# Patient Record
Sex: Female | Born: 1990 | Hispanic: Yes | Marital: Single | Smoking: Never smoker
Health system: Southern US, Community
[De-identification: ages and names within clinical notes are randomized; demographics above are authoritative.]

## PROBLEM LIST (undated history)

## (undated) ENCOUNTER — Inpatient Hospital Stay (HOSPITAL_COMMUNITY): Payer: Self-pay

## (undated) DIAGNOSIS — Z789 Other specified health status: Secondary | ICD-10-CM

## (undated) HISTORY — PX: NO PAST SURGERIES: SHX2092

---
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
## 2012-06-09 ENCOUNTER — Ambulatory Visit: Payer: BC Managed Care – PPO | Admitting: Family Medicine

## 2012-06-09 VITALS — BP 110/60 | HR 77 | Temp 98.5°F | Resp 16 | Ht 61.0 in | Wt 141.0 lb

## 2012-06-09 DIAGNOSIS — K5900 Constipation, unspecified: Secondary | ICD-10-CM

## 2012-06-09 DIAGNOSIS — K625 Hemorrhage of anus and rectum: Secondary | ICD-10-CM

## 2012-06-09 MED ORDER — HYDROCORTISONE ACETATE 25 MG RE SUPP: 25.0000 mg | Freq: Two times a day (BID) | RECTAL | Status: DC

## 2012-06-09 NOTE — Progress Notes (Signed)
[facility] [address], [place] [address] [phone]  Date:  06/09/2012   Name:  Kristine Herman   DOB:  04-02-1990   MRN:  [id number]  PCP:  No primary provider on file.    Chief Complaint: Constipation and Rectal Bleeding   History of Present Illness:  Kristine Herman is a 22 y.o. very pleasant female patient who presents with the following:  She delivered a baby 6 months ago- a baby girl who is doing well   Since delivery she had noted problems with constipation- she has to strain to have a BM, and only has a BM about twice a week.  She is no longer nursing the baby.   She is no longer on pre-natal vitamins, not taking iron- unsure of anything that could have caused her constipation.   She has tried OTC fiber but has not had success  She has seen blood in the toilet bowl- she has noted this for about 2 months. It occurs with each BM, but these are infrequent as above.  She has pain when she has a BM.   She is otherwise healthy.  She has her menses right now.  Her last BM was 2 days ago.   She had a vaginal delivery  She has never had this problem in the past.    Patient Active Problem List  Diagnosis  . NSVD (normal spontaneous vaginal delivery)    Past Medical History  Diagnosis Date  . No pertinent past medical history     Past Surgical History  Procedure Laterality Date  . No past surgeries      History  Substance Use Topics  . Smoking status: Never Smoker   . Smokeless tobacco: Not on file  . Alcohol Use: No    History reviewed. No pertinent family history.  No Known Allergies  Medication list has been reviewed and updated.  Current Outpatient Prescriptions on File Prior to Visit  Medication Sig Dispense Refill  . ibuprofen (ADVIL,MOTRIN) 600 MG tablet Take 1 tablet (600 mg total) by mouth every 6 (six) hours.  30 tablet  1  . Prenatal Vit-Fe Fumarate-FA (PRENATAL MULTIVITAMIN) TABS Take 1 tablet by mouth  every morning.      . senna-docusate (SENOKOT-S) 8.6-50 MG per tablet Take 2 tablets by mouth at bedtime.  60 tablet  1   No current facility-administered medications on file prior to visit.    Review of Systems: As per HPI- otherwise negative.  Physical Examination: Filed Vitals:   06/09/12 1441  BP: 110/60  Pulse: 77  Temp: 98.5 F (36.9 C)  Resp: 16   Filed Vitals:   06/09/12 1441  Height: 5\' 1"  (1.549 m)  Weight: 141 lb (63.957 kg)   Body mass index is 26.66 kg/(m^2). Ideal Body Weight: Weight in (lb) to have BMI = 25: 132  GEN: WDWN, NAD, Non-toxic, A & O x 3 HEENT: Atraumatic, Normocephalic. Neck supple. No masses, No LAD. Ears and Nose: No external deformity. CV: RRR, No M/G/R. No JVD. No thrill. No extra heart sounds. PULM: CTA B, no wheezes, crackles, rhonchi. No retractions. No resp. distress. No accessory muscle use. ABD: S, NT, ND, +BS. No rebound. No HSM. EXTR: No c/c/e NEURO Normal gait.  PSYCH: Normally interactive. Conversant. Not depressed or anxious appearing.  Calm demeanor.  Rectal exam: no visible external fissures or hemorrhoids.    Performed anoscopy- visible fissure in the anal canal, no hemorrhoids noted.  Assessment and Plan: Rectal bleeding - Plan: hydrocortisone (ANUSOL-HC) 25 MG suppository  Unspecified constipation  Constipation causing rectal bleeding- will treat as per pt instructions.  Advised to recheck in one month with [place] or her PCP to ensure her stool is free of blood.    Abbe Amsterdam, MD

## 2012-06-09 NOTE — Patient Instructions (Addendum)
Your constipation is causing your pain and bleeding- we need to get your stool softened to allow your bottom to heal.    Please purchase some miralax over the counter and use according to the directions- one dose a day.  Also start eating a serving of high fiber cereal such as all bran or fiber one each day.  Drink lots of water, and you may try some fruit juice as well.   Use the suppositories as needed to help your anal canal to heal.

## 2013-02-24 ENCOUNTER — Encounter (HOSPITAL_COMMUNITY): Payer: Self-pay | Admitting: Emergency Medicine

## 2013-02-24 ENCOUNTER — Emergency Department (INDEPENDENT_AMBULATORY_CARE_PROVIDER_SITE_OTHER)
Admission: EM | Admit: 2013-02-24 | Discharge: 2013-02-24 | Disposition: A | Discharge status: Home / Self Care | Payer: BC Managed Care – PPO | Source: Home / Self Care

## 2013-02-24 DIAGNOSIS — Y9389 Activity, other specified: Secondary | ICD-10-CM

## 2013-02-24 DIAGNOSIS — R0789 Other chest pain: Secondary | ICD-10-CM

## 2013-02-24 DIAGNOSIS — T148XXA Other injury of unspecified body region, initial encounter: Secondary | ICD-10-CM

## 2013-02-24 DIAGNOSIS — R071 Chest pain on breathing: Secondary | ICD-10-CM

## 2013-02-24 DIAGNOSIS — Y9229 Other specified public building as the place of occurrence of the external cause: Secondary | ICD-10-CM

## 2013-02-24 NOTE — ED Provider Notes (Signed)
Medical screening examination/treatment/procedure(s) were performed by a resident physician or non-physician practitioner and as the supervising physician I was immediately available for consultation/collaboration.  Aviannah Castoro, MD    Neyah Ellerman S Halyn Flaugher, MD 02/24/13 1911 

## 2013-02-24 NOTE — ED Notes (Signed)
Patient states that she has had pain above the left breast for approx 4 weeks, first experienced pain after leaving the gym

## 2013-02-24 NOTE — ED Provider Notes (Signed)
CSN: [id number]     Arrival date & time 02/24/13  1044 History   First MD Initiated Contact with Patient 02/24/13 1126     Chief Complaint  Patient presents with  . Breast Pain   (Consider location/radiation/quality/duration/timing/severity/associated sxs/prior Treatment) HPI Comments: 22 year old female presents with left anterior chest wall pain. She states that approximately one month ago, 3 hours after working out in the gym she developed pain in the left anterior chest. It is worse with taking a deep breath and certain movements. She notices it when she presses on her chest is often tender. Is also worse when lying down and placed him in various positions. She has no shortness of breath, cough, exertional symptoms, heaviness, tightness, fullness or pressure. She considers herself to be in good health and  her past medical history is noncontributory.    Past Medical History  Diagnosis Date  . No pertinent past medical history    Past Surgical History  Procedure Laterality Date  . No past surgeries     No family history on file. History  Substance Use Topics  . Smoking status: Never Smoker   . Smokeless tobacco: Not on file  . Alcohol Use: No   OB History   Grav Para Term Preterm Abortions TAB SAB Ect Mult Living   1 1 1  0 0 0 0 0 0 1     Review of Systems  Constitutional: Negative for fever, chills and activity change.  HENT: Negative.   Respiratory: Negative.   Cardiovascular: Positive for chest pain. Negative for palpitations and leg swelling.  Gastrointestinal: Negative.   Musculoskeletal: Negative for back pain, gait problem, joint swelling, myalgias and neck pain.       As per HPI  Skin: Negative for color change, pallor and rash.  Neurological: Negative.     Allergies  Review of patient's allergies indicates no known allergies.  Home Medications   Current Outpatient Rx  Name  Route  Sig  Dispense  Refill  . ibuprofen (ADVIL,MOTRIN) 600 MG tablet   Oral  Take 1 tablet (600 mg total) by mouth every 6 (six) hours.   30 tablet   1    BP 110/69  Pulse 87  Temp(Src) 97.9 F (36.6 C) (Oral)  Resp 17  SpO2 96%  LMP 02/10/2013  Breastfeeding? No Physical Exam  Nursing note and vitals reviewed. Constitutional: She is oriented to person, place, and time. She appears well-developed and well-nourished. No distress.  HENT:  Head: Normocephalic and atraumatic.  Eyes: EOM are normal. Pupils are equal, round, and reactive to light.  Neck: Normal range of motion. Neck supple.  Cardiovascular: Normal rate, regular rhythm, normal heart sounds and intact distal pulses.   Pulmonary/Chest: Effort normal and breath sounds normal. No respiratory distress. She has no wheezes. She has no rales. She exhibits tenderness.  Tenderness elicited with deep palpation to the left upper anterior chest wall. No tenderness to the right anterior chest wall. Unable to reproduce the pain with arm movements such as abduction, butterfly movements.  Musculoskeletal: Normal range of motion. She exhibits no edema.  Neurological: She is alert and oriented to person, place, and time. No cranial nerve deficit.  Skin: Skin is warm and dry.  Psychiatric: She has a normal mood and affect.    ED Course  Procedures (including critical care time) Labs Review Labs Reviewed - No data to display Imaging Review No results found.     MDM   1. Right-sided chest wall pain  2. Muscle strain      Ice prn Ibuprofen prn No resistance training with L arm for at least a couple of more weeks, then start gradually Reassurance.  Hayden Rasmussen, NP 02/24/13 1227

## 2013-10-04 ENCOUNTER — Emergency Department (INDEPENDENT_AMBULATORY_CARE_PROVIDER_SITE_OTHER)
Admission: EM | Admit: 2013-10-04 | Discharge: 2013-10-04 | Disposition: A | Discharge status: Home / Self Care | Payer: BC Managed Care – PPO | Source: Home / Self Care | Attending: Emergency Medicine | Admitting: Emergency Medicine

## 2013-10-04 ENCOUNTER — Encounter (HOSPITAL_COMMUNITY): Payer: Self-pay | Admitting: Emergency Medicine

## 2013-10-04 ENCOUNTER — Emergency Department (INDEPENDENT_AMBULATORY_CARE_PROVIDER_SITE_OTHER): Payer: BC Managed Care – PPO

## 2013-10-04 DIAGNOSIS — J209 Acute bronchitis, unspecified: Secondary | ICD-10-CM

## 2013-10-04 MED ORDER — IPRATROPIUM BROMIDE 0.06 % NA SOLN: 2.0000 | Freq: Four times a day (QID) | NASAL | Status: DC

## 2013-10-04 MED ORDER — HYDROCOD POLST-CHLORPHEN POLST 10-8 MG/5ML PO LQCR: 5.0000 mL | Freq: Two times a day (BID) | ORAL | Status: DC | PRN

## 2013-10-04 MED ORDER — AZITHROMYCIN 250 MG PO TABS: ORAL_TABLET | ORAL | Status: DC

## 2013-10-04 NOTE — ED Provider Notes (Signed)
Chief Complaint   Chief Complaint  Patient presents with  . URI    History of Present Illness   Kristine Herman is a 23 year old female who made a trip to [place] a couple weeks ago. Upon her return back here, 12 days ago, she developed a dry cough, wheezing, chest tightness, chest pain, fever up to 100.2, chills, headache, aching in her back. She denies any sore throat, nasal congestion, rhinorrhea, or GI symptoms.  Review of Systems   Other than as noted above, the patient denies any of the following symptoms: Systemic:  No fevers, chills, sweats, or myalgias. Eye:  No redness or discharge. ENT:  No ear pain, headache, nasal congestion, drainage, sinus pressure, or sore throat. Neck:  No neck pain, stiffness, or swollen glands. Lungs:  No cough, sputum production, hemoptysis, wheezing, chest tightness, shortness of breath or chest pain. GI:  No abdominal pain, nausea, vomiting or diarrhea.  [place]   Past medical history, family history, social history, meds, and allergies were reviewed.   Physical exam   Vital signs:  BP 113/78  Pulse 111  Temp(Src) 100.2 F (37.9 C) (Oral)  Resp 14  SpO2 98%  LMP 09/15/2013 General:  Alert and oriented.  In no distress.  Skin warm and dry. Eye:  No conjunctival injection or drainage. Lids were normal. ENT:  TMs and canals were normal, without erythema or inflammation.  Nasal mucosa was clear and uncongested, without drainage.  Mucous membranes were moist.  Pharynx was clear with no exudate or drainage.  There were no oral ulcerations or lesions. Neck:  Supple, no adenopathy, tenderness or mass. Lungs:  No respiratory distress.  Lungs were clear to auscultation, without wheezes, rales or rhonchi.  Breath sounds were clear and equal bilaterally.  Heart:  Regular rhythm, without gallops, murmers or rubs. Skin:  Clear, warm, and dry, without rash or lesions.   Radiology   Dg Chest 2 View  10/04/2013   CLINICAL DATA:  Back pain, fever   EXAM: CHEST  2 VIEW  COMPARISON:  None.  FINDINGS: Normal mediastinum and cardiac silhouette. Normal pulmonary vasculature. No evidence of effusion, infiltrate, or pneumothorax. No acute bony abnormality.  IMPRESSION: No acute cardiopulmonary process.   Electronically Signed   By: Suzy Bouchard M.D.   On: 10/04/2013 10:13   Assessment     The encounter diagnosis was Acute bronchitis, unspecified organism.  Plan    1.  Meds:  The following meds were prescribed:   Discharge Medication List as of 10/04/2013 10:23 AM    START taking these medications   Details  azithromycin (ZITHROMAX Z-PAK) 250 MG tablet Take as directed., Normal    chlorpheniramine-HYDROcodone (TUSSIONEX) 10-8 MG/5ML LQCR Take 5 mLs by mouth every 12 (twelve) hours as needed for cough., Starting 10/04/2013, Until Discontinued, Normal    ipratropium (ATROVENT) 0.06 % nasal spray Place 2 sprays into both nostrils 4 (four) times daily., Starting 10/04/2013, Until Discontinued, Normal        2.  Patient Education/Counseling:  The patient was given appropriate handouts, self care instructions, and instructed in symptomatic relief.  Instructed to get extra fluids, rest, and use a cool mist vaporizer.    3.  Follow up:  The patient was told to follow up here if no better in 3 to 4 days, or sooner if becoming worse in any way, and given some red flag symptoms such as increasing fever, difficulty breathing, chest pain, or persistent vomiting which would prompt immediate return.  Follow  up here as needed.      Harden Mo, MD 10/04/13 [phone]

## 2013-10-04 NOTE — ED Notes (Signed)
URI syx x 10 days; recent return from [place] (flew)

## 2013-10-04 NOTE — Discharge Instructions (Signed)
Bronquitis aguda ( Acute Bronchitis) La bronquitis es una inflamacin de las vas respiratorias que se extienden desde la trquea [facility] pulmones (bronquios). La inflamacin produce la formacin de mucosidad. Esto produce tos, que es el sntoma ms frecuente de la bronquitis.  Cuando la bronquitis es [place], generalmente comienza de [place] sbita y desaparece luego de un par de semanas. El hbito de fumar, las alergias y el asma pueden empeorar la bronquitis. Los episodios repetidos de bronquitis pueden causar ms problemas pulmonares.  CAUSAS La causa ms frecuente de bronquitis aguda es el mismo virus que produce el resfro. El virus se propaga de persona a persona (contagioso).  Joice.   Cristy Hilts.   Tos con mucosidad.   Dolores [employer] cuerpo.   Congestin en el pecho.   Escalofros.   Falta de aire.   Dolor de Investment banker, operational.  DIAGNSTICO  La bronquitis aguda en general se diagnostica con un examen fsico. En algunos casos se indican otros estudios, como radiografas, para Clinical research associate.  TRATAMIENTO  La bronquitis aguda generalmente desaparece en un par de semanas. Con frecuencia no es Systems analyst. Los medicamentos se indican para aliviar la fiebre o la tos. Generalmente no es necesario el uso de antibiticos, Armed forces training and education officer pueden indicarse en ciertas ocasiones. En algunos casos, se recomienda el uso de un inhalador para mejorar la falta de aire y Aeronautical engineer tos. Un vaporizador de aire fro podr ayudarlo a [employer] bronquiales y Armed forces technical officer su eliminacin.  INSTRUCCIONES PARA EL CUIDADO EN EL HOGAR  Descanse lo suficiente.   Beba lquidos en abundancia para mantener la orina de color claro o amarillo plido (excepto que padezca una enfermedad que requiera la restriccin de lquidos). Tome mucho lquido para Radiation protection practitioner las secreciones y Tree surgeon.   Tome slo medicamentos de venta libre o recetados,  segn las indicaciones del mdico.   Evite fumar o aspirar el humo de otros fumadores. La exposicin al humo del cigarrillo o a irritantes qumicos har que la bronquitis empeore. Si fuma, considere el uso de goma de Higher education careers adviser o la aplicacin de parches en la piel que contengan nicotina para Public house manager los sntomas de abstinencia. Si deja de fumar, sus pulmones se curarn ms rpido.   Reduzca la probabilidad de otro brote de bronquitis aguda lavando sus manos con frecuencia, evitando a las personas que tengan sntomas y tratando de no tocarse las manos con la boca, la nariz o los ojos.   Concurra a las consultas de control con su mdico segn las indicaciones.  SOLICITE ATENCIN MDICA SI: Los sntomas no mejoran despus de 1 semana de tratamiento.  SOLICITE ATENCIN MDICA DE INMEDIATO SI:  Comienza a tener fiebre o escalofros cada vez ms intensos.   Siente dolor en el pecho.   Le falta el aire de manera preocupante.  La flema tiene [place].   Se deshidrata.  Se desmaya.  Tiene vmitos que se repiten.  Tiene un dolor de cabeza intenso. ASEGRESE DE QUE:   Comprende estas instrucciones.  Controlar su afeccin.  Recibir ayuda de inmediato si no mejora o si empeora. Document Released: 03/15/2005 Document Revised: 11/15/2012 [facility] Patient Information 2015 [place]. This information is not intended to replace advice given to you by your health care provider. Make sure you discuss any questions you have with your health care provider.  Most upper respiratory infections are caused by viruses and do not require antibiotics.  We try to save the antibiotics for  when we really need them to prevent bacteria from developing resistance to them.  Here are a few hints about things that can be done at home to help get over an upper respiratory infection quicker:  Get extra sleep and extra fluids.  Get 7 to 9 hours of sleep per night and 6 to 8 glasses of water a day.  Getting  extra sleep keeps the immune system from getting run down.  Most people with an upper respiratory infection are a little dehydrated.  The extra fluids also keep the secretions liquified and easier to deal with.  Also, get extra vitamin C.  4000 mg per day is the recommended dose. For the aches, headache, and fever, acetaminophen or ibuprofen are helpful.  These can be alternated every 4 hours.  People with liver disease should avoid large amounts of acetaminophen, and people with ulcer disease, gastroesophageal reflux, gastritis, congestive heart failure, chronic kidney disease, coronary artery disease and the elderly should avoid ibuprofen. For nasal congestion try Mucinex-D, or if you're having lots of sneezing or clear nasal drainage use Zyrtec-D. People with high blood pressure can take these if their blood pressure is controlled, if not, it's best to avoid the forms with a "D" (decongestants).  You can use the plain Mucinex, Allegra, Claritin, or Zyrtec even if your blood pressure is not controlled.   A Saline nasal spray such as Ocean Spray can also help.  You can add a decongestant sprays such as Afrin, but you should not use the decongestant sprays for more than 3 or 4 days since they can be habituating.  Breathe Rite nasal strips can also offer a non-drug alternative treatment to nasal congestion, especially at night. For people with symptoms of sinusitis, sleeping with your head elevated can be helpful.  For sinus pain, moist, hot compresses to the face may provide some relief.  Many people find that inhaling steam as in a shower or from a pot of steaming water can help. For any viral infection, zinc containing lozenges such as Cold-Eze or Zicam are helpful.  Zinc helps to fight viral infection.  Hot salt water gargles (8 oz of hot water, 1/2 tsp of table salt, and a pinch of baking soda) can give relief as well as hot beverages such as hot tea.  Sucrets extra strength lozenges will help the sore  throat.  For the cough, take Delsym 2 tsp every 12 hours.  It has also been found recently that Aleve can help control a cough.  The dose is 1 to 2 tablets twice daily with food.  This can be combined with Delsym. (Note, if you are taking ibuprofen, you should not take Aleve as well--take one or the other.) A cool mist vaporizer will help keep your mucous membranes from drying out.   It's important when you have an upper respiratory infection not to pass the infection to others.  This involves being very careful about the following:  Frequent hand washing or use of hand sanitizer, especially after coughing, sneezing, blowing your nose or touching your face, nose or eyes. Do not shake hands or touch anyone and try to avoid touching surfaces that other people use such as doorknobs, shopping carts, telephones and computer keyboards. Use tissues and dispose of them properly in a garbage can or ziplock bag. Cough into your sleeve. Do not let others eat or drink after you.  It's also important to recognize the signs of serious illness and get evaluated if they occur:  Any respiratory infection that lasts more than 7 to 10 days.  Yellow nasal drainage and sputum are not reliable indicators of a bacterial infection, but if they last for more than 1 week, see your doctor. Fever and sore throat can indicate strep. Fever and cough can indicate influenza or pneumonia. Any kind of severe symptom such as difficulty breathing, intractable vomiting, or severe pain should prompt you to see a doctor as soon as possible.   Your body's immune system is really the thing that will get rid of this infection.  Your immune system is comprised of 2 types of specialized cells called T cells and B cells.  T cells coordinate the array of cells in your body that engulf invading bacteria or viruses while B cells orchestrate the production of antibodies that neutralize infection.  Anything we do or any medications we give you,  will just strengthen your immune system or help it clear up the infection quicker.  Here are a few helpful hints to improve your immune system to help overcome this illness or to prevent future infections:  A few vitamins can improve the health of your immune system.  That's why your diet should include plenty of fruits, vegetables, fish, nuts, and whole grains.  Vitamin A and bet-carotene can increase the cells that fight infections (T cells and B cells).  Vitamin A is abundant in dark greens and orange vegetables such as spinach, greens, sweet potatoes, and carrots.  Vitamin B6 contributes to the maturation of white blood cells, the cells that fight disease.  Foods with vitamin B6 include cold cereal and bananas.  Vitamin C is credited with preventing colds because it increases white blood cells and also prevents cellular damage.  Citrus fruits, peaches and green and red bell peppers are all hight in vitamin C.  Vitamin E is an anti-oxidant that encourages the production of natural killer cells which reject foreign invaders and B cells that produce antibodies.  Foods high in vitamin E include wheat germ, nuts and seeds.  Foods high in omega-3 fatty acids found in foods like salmon, tuna and mackerel boost your immune system and help cells to engulf and absorb germs.  Probiotics are good bacteria that increase your T cells.  These can be found in yogurt and are available in supplements such as Culturelle or Align.  Moderate exercise increases the strength of your immune system and your ability to recover from illness.  I suggest 3 to 5 moderate intensity 30 minute workouts per week.    Sleep is another component of maintaining a strong immune system.  It enables your body to recuperate from the day's activities, stress and work.  My recommendation is to get between 7 and 9 hours of sleep per night.  If you smoke, try to quit completely or at least cut down.  Drink alcohol only in moderation if  at all.  No more than 2 drinks daily for men or 1 for women.  Get a flu vaccine early in the fall or if you have not gotten one yet, once this illness has run its course.  If you are over 65, a smoker, or an asthmatic, get a pneumococcal vaccine.  My final recommendation is to maintain a healthy weight.  Excess weight can impair the immune system by interfering with the way the immune system deals with invading viruses or bacteria.

## 2014-01-28 ENCOUNTER — Encounter (HOSPITAL_COMMUNITY): Payer: Self-pay | Admitting: Emergency Medicine

## 2014-04-02 ENCOUNTER — Ambulatory Visit: Payer: BLUE CROSS/BLUE SHIELD

## 2014-12-18 ENCOUNTER — Ambulatory Visit (INDEPENDENT_AMBULATORY_CARE_PROVIDER_SITE_OTHER): Payer: BLUE CROSS/BLUE SHIELD | Admitting: Emergency Medicine

## 2014-12-18 VITALS — BP 110/84 | HR 72 | Temp 98.4°F | Resp 16 | Ht 61.0 in | Wt 159.0 lb

## 2014-12-18 DIAGNOSIS — Z23 Encounter for immunization: Secondary | ICD-10-CM

## 2014-12-18 DIAGNOSIS — S61209A Unspecified open wound of unspecified finger without damage to nail, initial encounter: Secondary | ICD-10-CM | POA: Diagnosis not present

## 2014-12-18 MED ORDER — CEPHALEXIN 500 MG PO CAPS: 500.0000 mg | ORAL_CAPSULE | Freq: Three times a day (TID) | ORAL | Status: DC

## 2014-12-18 NOTE — Progress Notes (Signed)
Procedure Consent obtained. 1 cc 1% lido local anesthesia. Cleaned with soap and water. Wound explored. #2 simple interrupted sutures placed. Rest of wound left open to drain. Clean dressing placed. Care instructions discussed.

## 2014-12-18 NOTE — Patient Instructions (Signed)
Td Vaccine (Tetanus and Diphtheria): What You Need to Know 1. Why get vaccinated? Tetanus  and diphtheria are very serious diseases. They are rare in the [place] today, but people who do become infected often have severe complications. Td vaccine is used to protect adolescents and adults from both of these diseases. Both tetanus and diphtheria are infections caused by bacteria. Diphtheria spreads from person to person through coughing or sneezing. Tetanus-causing bacteria enter the body through cuts, scratches, or wounds. TETANUS (Lockjaw) causes painful muscle tightening and stiffness, usually all over the body.  It can lead to tightening of muscles in the head and neck so you can't open your mouth, swallow, or sometimes even breathe. Tetanus kills about 1 out of every 5 people who are infected. DIPHTHERIA can cause a thick coating to form in the back of the throat.  It can lead to breathing problems, paralysis, heart failure, and death. Before vaccines, the [place] saw as many as 200,000 cases a year of diphtheria and hundreds of cases of tetanus. Since vaccination began, cases of both diseases have dropped by about 99%. 2. Td vaccine Td vaccine can protect adolescents and adults from tetanus and diphtheria. Td is usually given as a booster dose every 10 years but it can also be given earlier after a severe and dirty wound or burn. Your doctor can give you more information. Td may safely be given at the same time as other vaccines. 3. Some people should not get this vaccine  If you ever had a life-threatening allergic reaction after a dose of any tetanus or diphtheria containing vaccine, OR if you have a severe allergy to any part of this vaccine, you should not get Td. Tell your doctor if you have any severe allergies.  Talk to your doctor if you:  have epilepsy or another nervous system problem,  had severe pain or swelling after any vaccine containing diphtheria or  tetanus,  ever had Guillain Barr Syndrome (GBS),  aren't feeling well on the day the shot is scheduled. 4. Risks of a vaccine reaction With a vaccine, like any medicine, there is a chance of side effects. These are usually mild and go away on their own. Serious side effects are also possible, but are very rare. Most people who get Td vaccine do not have any problems with it. Mild Problems  following Td (Did not interfere with activities)  Pain where the shot was given (about 8 people in 10)  Redness or swelling where the shot was given (about 1 person in 3)  Mild fever (about 1 person in 15)  Headache or Tiredness (uncommon) Moderate Problems following Td (Interfered with activities, but did not require medical attention)  Fever over 102F (rare) Severe Problems  following Td (Unable to perform usual activities; required medical attention)  Swelling, severe pain, bleeding and/or redness in the arm where the shot was given (rare). Problems that could happen after any vaccine:  Brief fainting spells can happen after any medical procedure, including vaccination. Sitting or lying down for about 15 minutes can help prevent fainting, and injuries caused by a fall. Tell your doctor if you feel dizzy, or have vision changes or ringing in the ears.  Severe shoulder pain and reduced range of motion in the arm where a shot was given can happen, very rarely, after a vaccination.  Severe allergic reactions from a vaccine are very rare, estimated at less than 1 in a million doses. If one were to occur,   it would usually be within a few minutes to a few hours after the vaccination. 5. What if there is a serious reaction? What should I look for?  Look for anything that concerns you, such as signs of a severe allergic reaction, very high fever, or behavior changes. Signs of a severe allergic reaction can include hives, swelling of the face and throat, difficulty breathing, a fast heartbeat,  dizziness, and weakness. These would usually start a few minutes to a few hours after the vaccination. What should I do?  If you think it is a severe allergic reaction or other emergency that can't wait, call 9-1-1 or get the person to the nearest hospital. Otherwise, call your doctor.  Afterward, the reaction should be reported to the Vaccine Adverse Event Reporting System (VAERS). Your doctor might file this report, or you can do it yourself through the VAERS web site at www.vaers.hhs.gov, or by calling 1-800-822-7967. VAERS is only for reporting reactions. They do not give medical advice. 6. The National Vaccine Injury Compensation Program The National Vaccine Injury Compensation Program (VICP) is a federal program that was created to compensate people who may have been injured by certain vaccines. Persons who believe they may have been injured by a vaccine can learn about the program and about filing a claim by calling 1-800-338-2382 or visiting the VICP website at www.hrsa.gov/vaccinecompensation. 7. How can I learn more?  Ask your doctor.  Contact your local or state health department.  Contact the Centers for Disease Control and Prevention (CDC):  Call 1-800-232-4636 (1-800-CDC-INFO)  Visit CDC's website at www.cdc.gov/vaccines CDC Td Vaccine Interim VIS (05/02/12) Document Released: 01/10/2006 Document Revised: 07/30/2013 Document Reviewed: 06/27/2013 [employer] Patient Information 2015 [employer]. This information is not intended to replace advice given to you by your health care provider. Make sure you discuss any questions you have with your health care provider.  

## 2014-12-18 NOTE — Progress Notes (Signed)
Subjective:  This chart was scribed for Arlyss Queen MD, by Tamsen Roers, at [facility].  This patient was seen in room 7 and the patient's care was started at 1:15 PM.    Patient ID: Kristine Herman, female    DOB: 07-29-1990, 24 y.o.   MRN: [id number]   HPI  HPI Comments: Kristine Herman is a 24 y.o. female who presents to the [facility] complaining of a laceration on her left thumb onset last night (10 pm) when she was trying to cut a plastic tag with a knife but cut her finger instead. She states that she is not able to feel a part of her thumb and states that it hurts when she pushes on a part of her arm.  She does not remember when her last tetanus was.  She has no other complaints or concerns today.    Patient Active Problem List   Diagnosis Date Noted  . NSVD (normal spontaneous vaginal delivery) 12/22/2011   Past Medical History  Diagnosis Date  . No pertinent past medical history    Past Surgical History  Procedure Laterality Date  . No past surgeries     No Known Allergies Prior to Admission medications   Medication Sig Start Date End Date Taking? Authorizing Shaterra Sanzone  azithromycin (ZITHROMAX Z-PAK) 250 MG tablet Take as directed. Patient not taking: Reported on 12/18/2014 10/04/13   Harden Mo, MD  chlorpheniramine-HYDROcodone (TUSSIONEX) 10-8 MG/5ML [facility] Take 5 mLs by mouth every 12 (twelve) hours as needed for cough. Patient not taking: Reported on 12/18/2014 10/04/13   Harden Mo, MD  ibuprofen (ADVIL,MOTRIN) 600 MG tablet Take 1 tablet (600 mg total) by mouth every 6 (six) hours. Patient not taking: Reported on 12/18/2014 12/22/11   Jacquelyn A McGill, MD  ipratropium (ATROVENT) 0.06 % nasal spray Place 2 sprays into both nostrils 4 (four) times daily. Patient not taking: Reported on 12/18/2014 10/04/13   Harden Mo, MD   Social History   Social History  . Marital Status: Single    Spouse Name: N/A  . Number of  Children: N/A  . Years of Education: N/A   Occupational History  . Not on file.   Social History Main Topics  . Smoking status: Never Smoker   . Smokeless tobacco: Not on file  . Alcohol Use: No  . Drug Use: No  . Sexual Activity: Yes   Other Topics Concern  . Not on file   Social History Narrative   ** Merged History Encounter **       Review of Systems  Constitutional: Negative for fever and chills.  Respiratory: Negative for cough, choking and shortness of breath.   Gastrointestinal: Negative for nausea and vomiting.  Musculoskeletal: Negative for neck pain and neck stiffness.  Skin: Positive for wound.  Neurological: Negative for speech difficulty.       Objective:   Physical Exam Filed Vitals:   12/18/14 1212  BP: 110/84  Pulse: 72  Temp: 98.4 F (36.9 C)  TempSrc: Oral  Resp: 16  Height: 5\' 1"  (1.549 m)  Weight: 159 lb (72.122 kg)  SpO2: 99%     CONSTITUTIONAL: Well developed/well nourished HEAD: Normocephalic/atraumatic EYES: EOMI/PERRL NEURO: Pt is awake/alert/appropriate, moves all extremitiesx4.  No facial droop.   EXTREMITIES: pulses normal/equal, full ROM SKIN: left thumb: she has a 2 cm open laceration over the distal phalanx of the left thumb.  There is full flexion and extension.  PSYCH: no abnormalities of mood noted, alert and oriented to situation        Assessment & Plan:  Wound closed with 2 sutures. It was washed thoroughly with soap and water. We'll cover with Keflex 500 3 times a day for 5 days days.I personally performed the services described in this documentation, which was scribed in my presence. The recorded information has been reviewed and is accurate.

## 2015-07-17 ENCOUNTER — Other Ambulatory Visit: Payer: Self-pay | Admitting: Internal Medicine

## 2015-07-17 ENCOUNTER — Other Ambulatory Visit (HOSPITAL_COMMUNITY)
Admission: RE | Admit: 2015-07-17 | Discharge: 2015-07-17 | Disposition: A | Discharge status: Home / Self Care | Payer: BLUE CROSS/BLUE SHIELD | Source: Ambulatory Visit | Attending: Internal Medicine | Admitting: Internal Medicine

## 2015-07-17 DIAGNOSIS — Z01419 Encounter for gynecological examination (general) (routine) without abnormal findings: Secondary | ICD-10-CM | POA: Insufficient documentation

## 2015-07-24 LAB — CYTOLOGY - PAP

## 2015-07-29 ENCOUNTER — Other Ambulatory Visit: Payer: Self-pay | Admitting: Internal Medicine

## 2015-07-29 DIAGNOSIS — R748 Abnormal levels of other serum enzymes: Secondary | ICD-10-CM

## 2015-08-01 ENCOUNTER — Ambulatory Visit
Admission: RE | Admit: 2015-08-01 | Discharge: 2015-08-01 | Disposition: A | Discharge status: Home / Self Care | Payer: BLUE CROSS/BLUE SHIELD | Source: Ambulatory Visit | Attending: Internal Medicine | Admitting: Internal Medicine

## 2015-08-01 DIAGNOSIS — R748 Abnormal levels of other serum enzymes: Secondary | ICD-10-CM

## 2015-09-11 ENCOUNTER — Ambulatory Visit (INDEPENDENT_AMBULATORY_CARE_PROVIDER_SITE_OTHER): Payer: BLUE CROSS/BLUE SHIELD | Admitting: Physician Assistant

## 2015-09-11 VITALS — BP 112/68 | HR 87 | Temp 98.2°F | Resp 16 | Ht 61.0 in | Wt 164.0 lb

## 2015-09-11 DIAGNOSIS — R102 Pelvic and perineal pain: Secondary | ICD-10-CM

## 2015-09-11 DIAGNOSIS — B373 Candidiasis of vulva and vagina: Secondary | ICD-10-CM

## 2015-09-11 DIAGNOSIS — N766 Ulceration of vulva: Secondary | ICD-10-CM

## 2015-09-11 DIAGNOSIS — R300 Dysuria: Secondary | ICD-10-CM

## 2015-09-11 DIAGNOSIS — Z113 Encounter for screening for infections with a predominantly sexual mode of transmission: Secondary | ICD-10-CM

## 2015-09-11 DIAGNOSIS — B3731 Acute candidiasis of vulva and vagina: Secondary | ICD-10-CM

## 2015-09-11 LAB — POC MICROSCOPIC URINALYSIS (UMFC): Mucus: ABSENT

## 2015-09-11 LAB — POCT URINALYSIS DIP (MANUAL ENTRY)
BILIRUBIN UA: NEGATIVE
BILIRUBIN UA: NEGATIVE
Blood, UA: NEGATIVE
GLUCOSE UA: NEGATIVE
NITRITE UA: NEGATIVE
PH UA: 7
Protein Ur, POC: NEGATIVE
Spec Grav, UA: 1.01
Urobilinogen, UA: 0.2

## 2015-09-11 LAB — POCT WET + KOH PREP
TRICH BY WET PREP: ABSENT
YEAST BY WET PREP: ABSENT

## 2015-09-11 MED ORDER — FLUCONAZOLE 150 MG PO TABS: 150.0000 mg | ORAL_TABLET | Freq: Once | ORAL | Status: DC

## 2015-09-11 MED ORDER — VALACYCLOVIR HCL 1 G PO TABS: 1000.0000 mg | ORAL_TABLET | Freq: Two times a day (BID) | ORAL | Status: DC

## 2015-09-11 NOTE — Progress Notes (Signed)
[facility] [address] [place] [address] [phone]  Date:  09/11/2015   Name:  Kristine Herman   DOB:  [birth year]   MRN:  [id number]  PCP:  No PCP Per Patient    Chief Complaint: Dysuria   History of Present Illness:  This is a 25 y.o. female who is presenting with vaginal pain x 3 days. States she thought it was a yeast or BV infection because she gets those frequency but this is unlike those infections. She is not having any vaginal discharge. She is having a lot of pain even with sitting and walking. She is having a little pressure in her lower abdomen. She states she tried to look at her vulva in the mirror and it looked swollen. She states she is particularly having a lot of pain on her right labia minora.  She is sexually active with a female partner. They have been together for 4 years. They use condoms for contraception. Last tested for STDs at another practice 1 month ago and all negative. At that time she was treated for a yeast infection with 1 diflucan tab.   Hematuria: no Fever/chills: some chills, no fever. Nausea/vomiting: no Back pain: no LMP: somewhere around 5/21 - 5/23  She states she is being followed at [facility] by her PCP for elevated liver enzymes. She is not sure which one, she states I think "ALP". She has been instructed to stop taking tylenol.  Review of Systems:  Review of Systems See HPI  Patient Active Problem List   Diagnosis Date Noted  . NSVD (normal spontaneous vaginal delivery) 12/22/2011    Prior to Admission medications   Not on File    Allergies  Allergen Reactions  . Latex     Past Surgical History  Procedure Laterality Date  . No past surgeries      Social History  Substance Use Topics  . Smoking status: Never Smoker   . Smokeless tobacco: Never Used  . Alcohol Use: No    History reviewed. No pertinent family history.  Medication list has been reviewed and updated.  Physical  Examination:  Physical Exam  Constitutional: She is oriented to person, place, and time. She appears well-developed and well-nourished. No distress.  HENT:  Head: Normocephalic and atraumatic.  Right Ear: Hearing normal.  Left Ear: Hearing normal.  Nose: Nose normal.  Eyes: Conjunctivae and lids are normal. Right eye exhibits no discharge. Left eye exhibits no discharge. No scleral icterus.  Cardiovascular: Normal rate, regular rhythm, normal heart sounds and normal pulses.   No murmur heard. Pulmonary/Chest: Effort normal and breath sounds normal. No respiratory distress. She has no wheezes. She has no rhonchi. She has no rales.  Abdominal: Soft. Normal appearance. There is tenderness (mild, suprapubic). There is no CVA tenderness.  Genitourinary: Uterus normal. There is no lesion on the right labia. There is no lesion on the left labia. Cervix exhibits no motion tenderness, no discharge and no friability. Right adnexum displays no tenderness and no fullness. Left adnexum displays no tenderness and no fullness. Vaginal discharge (mod, white clumpy) found.  2 ulcers in vestibule to the right of the urethra  Musculoskeletal: Normal range of motion.  Neurological: She is alert and oriented to person, place, and time.  Skin: Skin is warm, dry and intact. No lesion and no rash noted.  Psychiatric: She has a normal mood and affect. Her speech is normal and behavior is normal. Thought content normal.  BP 112/68 mmHg  Pulse 87  Temp(Src) 98.2 F (36.8 C) (Oral)  Resp 16  Ht 5\' 1"  (1.549 m)  Wt 164 lb (74.39 kg)  BMI 31.00 kg/m2  SpO2 99%  LMP 08/21/2015 (Approximate)  Results for orders placed or performed in visit on 09/11/15  POCT Microscopic Urinalysis ([facility])  Result Value Ref Range   WBC,UR,HPF,POC Moderate (A) None WBC/hpf   RBC,UR,HPF,POC None None RBC/hpf   Bacteria Few (A) None, Too numerous to count   Mucus Absent Absent   Epithelial Cells, UR Per Microscopy Few (A) None,  Too numerous to count cells/hpf  POCT urinalysis dipstick  Result Value Ref Range   Color, UA yellow yellow   Clarity, UA clear clear   Glucose, UA negative negative   Bilirubin, UA negative negative   Ketones, POC UA negative negative   Spec Grav, UA 1.010    Blood, UA negative negative   pH, UA 7.0    Protein Ur, POC negative negative   Urobilinogen, UA 0.2    Nitrite, UA Negative Negative   Leukocytes, UA moderate (2+) (A) Negative  POCT Wet + KOH Prep  Result Value Ref Range   Yeast by KOH Present Present, Absent   Yeast by wet prep Absent Present, Absent   WBC by wet prep Moderate (A) None, Few, Too numerous to count   Clue Cells Wet Prep HPF POC Few (A) None, Too numerous to count   Trich by wet prep Absent Present, Absent   Bacteria Wet Prep HPF POC Moderate (A) None, Few, Too numerous to count   Epithelial Cells By [employer] Pref ([facility]) Moderate (A) None, Few, Too numerous to count   RBC,UR,HPF,POC None None RBC/hpf    Assessment and Plan:  1. Genital ulcer, female 2. Vaginal pain 3. Screen for STD 4. 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 UA with mod leuks, urine culture pending. Wet prep + yeast. STD testing pending. Treat with valtrex BID x 7 days and diflucan. She has upcoming appt with pcp in 4 days -- before taking the 2nd tab of diflucan she will discuss with her PCP since she is being monitored for elevated LFTs. Pt will have her partner get testing. If needing suppressive therapy, she will discuss with PCP. We discussed disease process/transmission at length. - Herpes simplex virus culture - HSV(herpes smplx)abs-1+2(IgG+IgM)-bld - valACYclovir (VALTREX) 1000 MG tablet; Take 1 tablet (1,000 mg total) by mouth 2 (two) times daily.  Dispense: 14 tablet; Refill: 0 - POCT Microscopic Urinalysis ([facility]) - POCT urinalysis dipstick - POCT Wet + KOH Prep - Urine culture - GC/Chlamydia Probe Amp - RPR - HIV antibody - fluconazole  (DIFLUCAN) 150 MG tablet; Take 1 tablet (150 mg total) by mouth once. Repeat in 72 hours  Dispense: 2 tablet; Refill: 0   Benjaman Pott. Drenda Freeze, MHS [facility]  09/11/2015

## 2015-09-11 NOTE — Patient Instructions (Addendum)
Take valtrex twice a day for 7 days Diflucan once and then repeat in 3 days I will call you with all your lab results next Tuesday Your partner should get tested. No sex for 7-10 days and hopefully all results, yours and your partners are back by then Discuss meds with your primary doctor.  Call with any questions  Genital Herpes Genital herpes is a common sexually transmitted infection (STI) that is caused by a virus. The virus is spread from person to person through sexual contact. Infection can cause itching, blisters, and sores in the genital area or rectal area. This is called an outbreak. It affects both men and women. Genital herpes is particularly concerning for pregnant women because the virus can be passed to the baby during delivery and cause serious problems. Genital herpes is also a concern for people with a weakened defense (immune) system. Symptoms of genital herpes may last several days and then go away. However, the virus remains in your body, so you may have more outbreaks of symptoms in the future. The time between outbreaks varies and can be months or years. CAUSES Genital herpes is caused by a virus called herpes simplex virus (HSV) type 2 or HSV type 1. These viruses are contagious and are most often spread through sexual contact with an infected person. Sexual contact includes vaginal, anal, and oral sex. RISK FACTORS Risk factors for genital herpes include:  Being sexually active with multiple partners.  Having unprotected sex. SIGNS AND SYMPTOMS Symptoms may include:  Pain and itching in the genital area or rectal area.  Small red bumps that turn into blisters and then turn into sores.  Flu-like symptoms, including:  Fever.  Body aches.  Painful urination.  Vaginal discharge. DIAGNOSIS Genital herpes may be diagnosed by:  Physical exam.  Blood test.  Fluid culture test from an open sore. TREATMENT There is no cure for genital herpes. Oral antiviral  medicines may be used to speed up healing and to help prevent the return of symptoms. These medicines can also help to reduce the spread of the virus to sexual partners. HOME CARE INSTRUCTIONS  Keep the affected areas dry and clean.  Take medicines only as directed by your health care provider.  Do not have sexual contact during active infections. Genital herpes is contagious.  Practice safe sex. Latex condoms and female condoms may help to prevent the spread of the herpes virus.  Avoid rubbing or touching the blisters and sores. If you do touch the blister or sores:  Wash your hands thoroughly.  Do not touch your eyes afterward.  If you become pregnant, tell your health care provider if you have had genital herpes.  Keep all follow-up visits as directed by your health care provider. This is important. PREVENTION  Use condoms. Although anyone can contract genital herpes during sexual contact even with the use of a condom, a condom can provide some protection.  Avoid having multiple sexual partners.  Talk to your sexual partner about any symptoms and past history that either of you may have.  Get tested before you have sex. Ask your partner to do the same.  Recognize the symptoms of genital herpes. Do not have sexual contact if you notice these symptoms. SEEK MEDICAL CARE IF:  Your symptoms are not improving with medicine.  Your symptoms return.  You have new symptoms.  You have a fever.  You have abdominal pain.  You have redness, swelling, or pain in your eye. MAKE SURE  YOU:  Understand these instructions.  Will watch your condition.  Will get help right away if you are not doing well or get worse.   This information is not intended to replace advice given to you by your health care provider. Make sure you discuss any questions you have with your health care provider.   Document Released: 03/12/2000 Document Revised: 04/05/2014 Document Reviewed:  07/31/2013 Elsevier Interactive Patient Education 2016 [employer].     IF you received an x-ray today, you will receive an invoice from [facility]. Please contact [facility] at [phone] with questions or concerns regarding your invoice.   IF you received labwork today, you will receive an invoice from [employer]. Please contact [facility] at [phone] with questions or concerns regarding your invoice.   Our billing staff will not be able to assist you with questions regarding bills from these companies.  You will be contacted with the lab results as soon as they are available. The fastest way to get your results is to activate your My Chart account. Instructions are located on the last page of this paperwork. If you have not heard from [place] regarding the results in 2 weeks, please contact this office.

## 2015-09-12 LAB — GC/CHLAMYDIA PROBE AMP
CT Probe RNA: NOT DETECTED
GC PROBE AMP APTIMA: NOT DETECTED

## 2015-09-12 LAB — URINE CULTURE: Colony Count: 60000

## 2015-09-12 LAB — HIV ANTIBODY (ROUTINE TESTING W REFLEX): HIV: NONREACTIVE

## 2015-09-12 LAB — RPR

## 2015-09-15 LAB — HSV(HERPES SMPLX)ABS-I+II(IGG+IGM)-BLD
HERPES SIMPLEX VRS I-IGM AB (EIA): 0.77 {index}
HSV 1 Glycoprotein G Ab, IgG: 2.03 Index — ABNORMAL HIGH (ref <0.90)
HSV 2 Glycoprotein G Ab, IgG: <0.9 Index (ref <0.90)

## 2015-09-15 LAB — HERPES SIMPLEX VIRUS CULTURE: ORGANISM ID, BACTERIA: DETECTED

## 2015-09-16 ENCOUNTER — Other Ambulatory Visit: Payer: Self-pay | Admitting: Physician Assistant

## 2015-09-16 ENCOUNTER — Telehealth: Payer: Self-pay

## 2015-09-16 DIAGNOSIS — A6000 Herpesviral infection of urogenital system, unspecified: Secondary | ICD-10-CM

## 2015-09-16 MED ORDER — VALACYCLOVIR HCL 500 MG PO TABS: ORAL_TABLET | ORAL | Status: AC

## 2015-09-16 NOTE — Telephone Encounter (Signed)
Discussed results with pt by phone. Sent in valtrex to use as needed for future outbreaks. Partner is getting tested for HSV -- she will let me know if he is negative and if she wants to be on suppression.

## 2015-09-16 NOTE — Telephone Encounter (Signed)
Please review

## 2015-09-16 NOTE — Telephone Encounter (Signed)
Pt is looking for lab results  Best number [phone]

## 2015-10-24 ENCOUNTER — Other Ambulatory Visit: Payer: Self-pay | Admitting: Urology

## 2015-10-24 DIAGNOSIS — D4111 Neoplasm of uncertain behavior of right renal pelvis: Secondary | ICD-10-CM

## 2015-11-14 ENCOUNTER — Ambulatory Visit (HOSPITAL_COMMUNITY)
Admission: RE | Admit: 2015-11-14 | Discharge: 2015-11-14 | Disposition: A | Discharge status: Home / Self Care | Payer: BLUE CROSS/BLUE SHIELD | Source: Ambulatory Visit | Attending: Urology | Admitting: Urology

## 2015-11-14 DIAGNOSIS — D4111 Neoplasm of uncertain behavior of right renal pelvis: Secondary | ICD-10-CM | POA: Insufficient documentation

## 2015-11-14 MED ORDER — GADOBENATE DIMEGLUMINE 529 MG/ML IV SOLN
15.0000 mL | Freq: Once | INTRAVENOUS | Status: AC | PRN
  Administered 2015-11-14: 15 mL via INTRAVENOUS

## 2016-01-28 ENCOUNTER — Other Ambulatory Visit: Payer: Self-pay | Admitting: Surgery

## 2016-02-11 ENCOUNTER — Encounter (HOSPITAL_BASED_OUTPATIENT_CLINIC_OR_DEPARTMENT_OTHER): Payer: Self-pay | Admitting: *Deleted

## 2016-02-16 NOTE — H&P (Signed)
Kristine Herman 01/28/2016 10:00 AM Location: [facility] Patient #: [id number] DOB: 1990-05-27 Single / Language: Cleophus Molt / Race: White Female   History of Present Illness (Skyanne Welle A. Ninfa Linden MD; 01/28/2016 10:15 AM) The patient is a 25 year old female presenting for a post-operative visit. This is a very pleasant patient referred by Dr. Arta Silence for symptomatic gallbladder sludge. She has been having attacks of right upper quadrant abdominal pain with nausea and vomiting since June of this year. She had an ultrasound showing gallbladder sludge. She also had elevated liver function tests. She has since had an upper endoscopy with endoscopic ultrasound that showed no other abnormalities other than the gallbladder sludge. The pain is moderate to severe. It occurs with fatty meals. She is otherwise healthy and without complaints.   Other Problems Nance Pear, [place]; 01/28/2016 10:00 AM) Bladder Problems  Past Surgical History Nance Pear, [place]; 01/28/2016 10:00 AM) No pertinent past surgical history  Diagnostic Studies History Nance Pear, [place]; 01/28/2016 10:00 AM) Colonoscopy never Mammogram never Pap Smear 1-5 years ago  Allergies Nance Pear, CMA; 01/28/2016 10:01 AM) Latex Exam Gloves *MEDICAL DEVICES AND SUPPLIES*  Medication History Nance Pear, CMA; 01/28/2016 10:02 AM) PriLOSEC (20MG  Capsule DR, Oral) Active. Valtrex (500MG  Tablet, Oral) Active. Medications Reconciled  Social History Nance Pear, [place]; 01/28/2016 10:00 AM) Caffeine use Coffee, Tea. No alcohol use No drug use Tobacco use Never smoker.  Family History Nance Pear, [place]; 01/28/2016 10:00 AM) First Degree Relatives No pertinent family history  Pregnancy / Birth History Nance Pear, [place]; 01/28/2016 10:00 AM) Age at menarche 26 years. Contraceptive History Oral contraceptives. Gravida 1 Irregular periods Length (months) of breastfeeding 3-6 Maternal  age 47-20 Para 1    Review of Systems ([place]; 01/28/2016 10:00 AM) General Present- Chills. Not Present- Appetite Loss, Fatigue, Fever, Night Sweats, Weight Gain and Weight Loss. Skin Not Present- Change in Wart/Mole, Dryness, Hives, Jaundice, New Lesions, Non-Healing Wounds, Rash and Ulcer. HEENT Not Present- Earache, Hearing Loss, Hoarseness, Nose Bleed, Oral Ulcers, Ringing in the Ears, Seasonal Allergies, Sinus Pain, Sore Throat, Visual Disturbances, Wears glasses/contact lenses and Yellow Eyes. Respiratory Not Present- Bloody sputum, Chronic Cough, Difficulty Breathing, Snoring and Wheezing. Breast Not Present- Breast Mass, Breast Pain, Nipple Discharge and Skin Changes. Cardiovascular Not Present- Chest Pain, Difficulty Breathing Lying Down, Leg Cramps, Palpitations, Rapid Heart Rate, Shortness of Breath and Swelling of Extremities. Gastrointestinal Present- Abdominal Pain and Bloating. Not Present- Bloody Stool, Change in Bowel Habits, Chronic diarrhea, Constipation, Difficulty Swallowing, Excessive gas, Gets full quickly at meals, Hemorrhoids, Indigestion, Nausea, Rectal Pain and Vomiting. Female Genitourinary Not Present- Frequency, Nocturia, Painful Urination, Pelvic Pain and Urgency. Musculoskeletal Not Present- Back Pain, Joint Pain, Joint Stiffness, Muscle Pain, Muscle Weakness and Swelling of Extremities. Neurological Not Present- Decreased Memory, Fainting, Headaches, Numbness, Seizures, Tingling, Tremor, Trouble walking and Weakness. Psychiatric Not Present- Anxiety, Bipolar, Change in Sleep Pattern, Depression, Fearful and Frequent crying. Endocrine Not Present- Cold Intolerance, Excessive Hunger, Hair Changes, Heat Intolerance, Hot flashes and New Diabetes. Hematology Not Present- Blood Thinners, Easy Bruising, Excessive bleeding, Gland problems, HIV and Persistent Infections.  Vitals Bary Castilla Bradford CMA; 01/28/2016 10:03 AM) 01/28/2016 10:02 AM Weight: 167 lb  Height: 62in Body Surface Area: 1.77 m Body Mass Index: 30.54 kg/m  Temp.: 98.23F  Pulse: 92 (Regular)  BP: 102/72 (Sitting, Left Arm, Standard)       Physical Exam (Andie Mortimer A. Ninfa Linden MD; 01/28/2016 10:16 AM) General Mental Status-Alert. General Appearance-Consistent with stated age. Hydration-Well hydrated. Voice-Normal.  Head and Neck Head-normocephalic, atraumatic with no lesions or palpable masses.  Eye Eyeball - Bilateral-Extraocular movements intact. Sclera/Conjunctiva - Bilateral-No scleral icterus.  Chest and Lung Exam Chest and lung exam reveals -quiet, even and easy respiratory effort with no use of accessory muscles and on auscultation, normal breath sounds, no adventitious sounds and normal vocal resonance. Inspection Chest Wall - Normal. Back - normal.  Cardiovascular Cardiovascular examination reveals -on palpation PMI is normal in location and amplitude, no palpable S3 or S4. Normal cardiac borders., normal heart sounds, regular rate and rhythm with no murmurs, carotid auscultation reveals no bruits and normal pedal pulses bilaterally.  Abdomen Inspection Inspection of the abdomen reveals - No Hernias. Skin - Scar - no surgical scars. Palpation/Percussion Palpation and Percussion of the abdomen reveal - Soft, No Rebound tenderness, No Rigidity (guarding) and No hepatosplenomegaly. Tenderness - Right Upper Quadrant. Auscultation Auscultation of the abdomen reveals - Bowel sounds normal.  Neurologic Neurologic evaluation reveals -alert and oriented x 3 with no impairment of recent or remote memory. Mental Status-Normal.  Musculoskeletal Normal Exam - Left-Upper Extremity Strength Normal and Lower Extremity Strength Normal. Normal Exam - Right-Upper Extremity Strength Normal, Lower Extremity Weakness.    Assessment & Plan ([place] A. Ninfa Linden MD; 01/28/2016 10:17 AM) SYMPTOMATIC CHOLELITHIASIS (K80.20) Impression: I  discussed the diagnosis with her. I believe she has symptomatic gallbladder sludge. I suspect she may have some mild chronic cholecystitis as well. I recommend laparoscopic cholecystectomy. I gave her literature regarding the surgery. I discussed the surgery with her in detail. I discussed the risks which includes but is not limited to bleeding, infection, bile duct injury, bile leak, the need to convert to an open procedure, cardiopulmonary issues, postoperative recovery, etc. After discussion, she understands and wishes to proceed with surgery Current Plans Pt Education - [facility] Free Text Education/Instructions: discussed with patient and provided information.

## 2016-02-17 ENCOUNTER — Encounter (HOSPITAL_BASED_OUTPATIENT_CLINIC_OR_DEPARTMENT_OTHER)
Admission: RE | Disposition: A | Discharge status: Home / Self Care | Payer: Self-pay | Source: Ambulatory Visit | Attending: Surgery

## 2016-02-17 ENCOUNTER — Encounter (HOSPITAL_BASED_OUTPATIENT_CLINIC_OR_DEPARTMENT_OTHER): Payer: Self-pay | Admitting: Anesthesiology

## 2016-02-17 ENCOUNTER — Ambulatory Visit (HOSPITAL_BASED_OUTPATIENT_CLINIC_OR_DEPARTMENT_OTHER): Payer: BLUE CROSS/BLUE SHIELD | Admitting: Anesthesiology

## 2016-02-17 ENCOUNTER — Ambulatory Visit (HOSPITAL_BASED_OUTPATIENT_CLINIC_OR_DEPARTMENT_OTHER)
Admission: RE | Admit: 2016-02-17 | Discharge: 2016-02-17 | Disposition: A | Discharge status: Home / Self Care | Payer: BLUE CROSS/BLUE SHIELD | Source: Ambulatory Visit | Attending: Surgery | Admitting: Surgery

## 2016-02-17 DIAGNOSIS — K811 Chronic cholecystitis: Secondary | ICD-10-CM | POA: Diagnosis not present

## 2016-02-17 HISTORY — PX: CHOLECYSTECTOMY: SHX55

## 2016-02-17 SURGERY — LAPAROSCOPIC CHOLECYSTECTOMY
Anesthesia: General | Site: Abdomen

## 2016-02-17 MED ORDER — IOPAMIDOL (ISOVUE-300) INJECTION 61%
INTRAVENOUS | Status: AC
  Filled 2016-02-17: qty 50

## 2016-02-17 MED ORDER — DEXAMETHASONE SODIUM PHOSPHATE 4 MG/ML IJ SOLN
INTRAMUSCULAR | Status: DC | PRN
  Administered 2016-02-17: 10 mg via INTRAVENOUS

## 2016-02-17 MED ORDER — PROMETHAZINE HCL 25 MG/ML IJ SOLN
6.2500 mg | INTRAMUSCULAR | Status: DC | PRN
  Administered 2016-02-17: 6.25 mg via INTRAVENOUS

## 2016-02-17 MED ORDER — CEFAZOLIN SODIUM-DEXTROSE 2-4 GM/100ML-% IV SOLN
2.0000 g | INTRAVENOUS | Status: AC
  Administered 2016-02-17: 2 g via INTRAVENOUS

## 2016-02-17 MED ORDER — SUCCINYLCHOLINE CHLORIDE 20 MG/ML IJ SOLN: INTRAMUSCULAR | Status: DC | PRN

## 2016-02-17 MED ORDER — PROMETHAZINE HCL 25 MG/ML IJ SOLN
INTRAMUSCULAR | Status: AC
  Filled 2016-02-17: qty 1

## 2016-02-17 MED ORDER — ACETAMINOPHEN 10 MG/ML IV SOLN
INTRAVENOUS | Status: DC | PRN
  Administered 2016-02-17: 1000 mg via INTRAVENOUS

## 2016-02-17 MED ORDER — ROCURONIUM BROMIDE 10 MG/ML (PF) SYRINGE
PREFILLED_SYRINGE | INTRAVENOUS | Status: AC
  Filled 2016-02-17: qty 10

## 2016-02-17 MED ORDER — CHLORHEXIDINE GLUCONATE CLOTH 2 % EX PADS: 6.0000 | MEDICATED_PAD | Freq: Once | CUTANEOUS | Status: DC

## 2016-02-17 MED ORDER — LIDOCAINE HCL (CARDIAC) 20 MG/ML IV SOLN
INTRAVENOUS | Status: DC | PRN
  Administered 2016-02-17: 100 mg via INTRAVENOUS

## 2016-02-17 MED ORDER — HYDROMORPHONE HCL 1 MG/ML IJ SOLN
0.2500 mg | INTRAMUSCULAR | Status: DC | PRN
  Administered 2016-02-17 (×5): 0.5 mg via INTRAVENOUS

## 2016-02-17 MED ORDER — ONDANSETRON HCL 4 MG/2ML IJ SOLN
INTRAMUSCULAR | Status: DC | PRN
  Administered 2016-02-17: 4 mg via INTRAVENOUS

## 2016-02-17 MED ORDER — SCOPOLAMINE 1 MG/3DAYS TD PT72: 1.0000 | MEDICATED_PATCH | Freq: Once | TRANSDERMAL | Status: DC | PRN

## 2016-02-17 MED ORDER — OXYCODONE-ACETAMINOPHEN 5-325 MG PO TABS: 1.0000 | ORAL_TABLET | ORAL | 0 refills | Status: AC | PRN

## 2016-02-17 MED ORDER — KETOROLAC TROMETHAMINE 30 MG/ML IJ SOLN
INTRAMUSCULAR | Status: DC | PRN
  Administered 2016-02-17: 30 mg via INTRAVENOUS

## 2016-02-17 MED ORDER — FENTANYL CITRATE (PF) 100 MCG/2ML IJ SOLN
INTRAMUSCULAR | Status: AC
  Filled 2016-02-17: qty 2

## 2016-02-17 MED ORDER — OXYCODONE HCL 5 MG PO TABS
ORAL_TABLET | ORAL | Status: AC
  Filled 2016-02-17: qty 1

## 2016-02-17 MED ORDER — KETOROLAC TROMETHAMINE 30 MG/ML IJ SOLN
INTRAMUSCULAR | Status: AC
  Filled 2016-02-17: qty 1

## 2016-02-17 MED ORDER — BUPIVACAINE HCL (PF) 0.5 % IJ SOLN
INTRAMUSCULAR | Status: DC | PRN
  Administered 2016-02-17: 20 mL

## 2016-02-17 MED ORDER — MIDAZOLAM HCL 2 MG/2ML IJ SOLN
INTRAMUSCULAR | Status: AC
  Filled 2016-02-17: qty 2

## 2016-02-17 MED ORDER — PROPOFOL 10 MG/ML IV BOLUS
INTRAVENOUS | Status: AC
  Filled 2016-02-17: qty 20

## 2016-02-17 MED ORDER — DEXAMETHASONE SODIUM PHOSPHATE 10 MG/ML IJ SOLN
INTRAMUSCULAR | Status: AC
  Filled 2016-02-17: qty 1

## 2016-02-17 MED ORDER — SODIUM CHLORIDE 0.9 % IJ SOLN
INTRAMUSCULAR | Status: AC
  Filled 2016-02-17: qty 50

## 2016-02-17 MED ORDER — HYDROMORPHONE HCL 1 MG/ML IJ SOLN
INTRAMUSCULAR | Status: AC
  Filled 2016-02-17: qty 1

## 2016-02-17 MED ORDER — SODIUM CHLORIDE 0.9 % IR SOLN
Status: DC | PRN
  Administered 2016-02-17: 1000 mL

## 2016-02-17 MED ORDER — MEPERIDINE HCL 25 MG/ML IJ SOLN: 6.2500 mg | INTRAMUSCULAR | Status: DC | PRN

## 2016-02-17 MED ORDER — ONDANSETRON HCL 4 MG/2ML IJ SOLN
INTRAMUSCULAR | Status: AC
  Filled 2016-02-17: qty 2

## 2016-02-17 MED ORDER — ACETAMINOPHEN 10 MG/ML IV SOLN
INTRAVENOUS | Status: AC
  Filled 2016-02-17: qty 100

## 2016-02-17 MED ORDER — BUPIVACAINE-EPINEPHRINE (PF) 0.5% -1:200000 IJ SOLN: INTRAMUSCULAR | Status: DC | PRN

## 2016-02-17 MED ORDER — PROPOFOL 10 MG/ML IV BOLUS
INTRAVENOUS | Status: DC | PRN
  Administered 2016-02-17: 200 mg via INTRAVENOUS

## 2016-02-17 MED ORDER — ROCURONIUM BROMIDE 100 MG/10ML IV SOLN
INTRAVENOUS | Status: DC | PRN
  Administered 2016-02-17: 40 mg via INTRAVENOUS

## 2016-02-17 MED ORDER — SUGAMMADEX SODIUM 200 MG/2ML IV SOLN
INTRAVENOUS | Status: DC | PRN
  Administered 2016-02-17: 200 mg via INTRAVENOUS

## 2016-02-17 MED ORDER — MIDAZOLAM HCL 2 MG/2ML IJ SOLN
1.0000 mg | INTRAMUSCULAR | Status: DC | PRN
  Administered 2016-02-17: 2 mg via INTRAVENOUS

## 2016-02-17 MED ORDER — FENTANYL CITRATE (PF) 100 MCG/2ML IJ SOLN
50.0000 ug | INTRAMUSCULAR | Status: DC | PRN
  Administered 2016-02-17: 100 ug via INTRAVENOUS
  Administered 2016-02-17: 50 ug via INTRAVENOUS

## 2016-02-17 MED ORDER — BUPIVACAINE HCL (PF) 0.5 % IJ SOLN
INTRAMUSCULAR | Status: AC
  Filled 2016-02-17: qty 30

## 2016-02-17 MED ORDER — OXYCODONE HCL 5 MG PO TABS
5.0000 mg | ORAL_TABLET | Freq: Once | ORAL | Status: AC | PRN
  Administered 2016-02-17: 5 mg via ORAL

## 2016-02-17 MED ORDER — SUGAMMADEX SODIUM 200 MG/2ML IV SOLN
INTRAVENOUS | Status: AC
  Filled 2016-02-17: qty 2

## 2016-02-17 MED ORDER — OXYCODONE HCL 5 MG/5ML PO SOLN: 5.0000 mg | Freq: Once | ORAL | Status: AC | PRN

## 2016-02-17 MED ORDER — CEFAZOLIN SODIUM-DEXTROSE 2-4 GM/100ML-% IV SOLN
INTRAVENOUS | Status: AC
  Filled 2016-02-17: qty 100

## 2016-02-17 MED ORDER — LACTATED RINGERS IV SOLN
INTRAVENOUS | Status: DC
  Administered 2016-02-17 (×2): via INTRAVENOUS

## 2016-02-17 MED ORDER — LIDOCAINE 2% (20 MG/ML) 5 ML SYRINGE
INTRAMUSCULAR | Status: AC
  Filled 2016-02-17: qty 5

## 2016-02-17 SURGICAL SUPPLY — 42 items
APPLIER CLIP 5 13 M/L LIGAMAX5 (MISCELLANEOUS) ×4
BLADE CLIPPER SURG (BLADE) IMPLANT
CHLORAPREP W/TINT 26ML (MISCELLANEOUS) ×4 IMPLANT
CLIP APPLIE 5 13 M/L LIGAMAX5 (MISCELLANEOUS) ×2 IMPLANT
COVER MAYO STAND STRL (DRAPES) IMPLANT
DECANTER SPIKE VIAL GLASS SM (MISCELLANEOUS) IMPLANT
DERMABOND ADVANCED (GAUZE/BANDAGES/DRESSINGS) ×2
DERMABOND ADVANCED .7 DNX12 (GAUZE/BANDAGES/DRESSINGS) ×2 IMPLANT
DRAPE C-ARM 42X72 X-RAY (DRAPES) IMPLANT
DRAPE LAPAROSCOPIC ABDOMINAL (DRAPES) IMPLANT
ELECT REM PT RETURN 9FT ADLT (ELECTROSURGICAL) ×4
ELECTRODE REM PT RTRN 9FT ADLT (ELECTROSURGICAL) ×2 IMPLANT
FILTER SMOKE EVAC LAPAROSHD (FILTER) IMPLANT
GLOVE BIOGEL PI IND STRL 6.5 (GLOVE) ×2 IMPLANT
GLOVE BIOGEL PI IND STRL 7.0 (GLOVE) ×2 IMPLANT
GLOVE BIOGEL PI INDICATOR 6.5 (GLOVE) ×2
GLOVE BIOGEL PI INDICATOR 7.0 (GLOVE) ×2
GLOVE SURG SIGNA 7.5 PF LTX (GLOVE) IMPLANT
GLOVE SURG SS PI 6.5 STRL IVOR (GLOVE) ×12 IMPLANT
GLOVE SURG SS PI 7.5 STRL IVOR (GLOVE) ×4 IMPLANT
GOWN STRL REUS W/ TWL LRG LVL3 (GOWN DISPOSABLE) ×6 IMPLANT
GOWN STRL REUS W/ TWL XL LVL3 (GOWN DISPOSABLE) ×2 IMPLANT
GOWN STRL REUS W/TWL LRG LVL3 (GOWN DISPOSABLE) ×6
GOWN STRL REUS W/TWL XL LVL3 (GOWN DISPOSABLE) ×2
HEMOSTAT SNOW SURGICEL 2X4 (HEMOSTASIS) IMPLANT
PACK BASIN DAY SURGERY FS (CUSTOM PROCEDURE TRAY) ×4 IMPLANT
POUCH SPECIMEN RETRIEVAL 10MM (ENDOMECHANICALS) ×4 IMPLANT
SCISSORS LAP 5X35 DISP (ENDOMECHANICALS) IMPLANT
SET CHOLANGIOGRAPH 5 50 .035 (SET/KITS/TRAYS/PACK) IMPLANT
SET IRRIG TUBING LAPAROSCOPIC (IRRIGATION / IRRIGATOR) ×4 IMPLANT
SLEEVE ENDOPATH XCEL 5M (ENDOMECHANICALS) ×8 IMPLANT
SLEEVE SCD COMPRESS KNEE MED (MISCELLANEOUS) ×4 IMPLANT
SPECIMEN JAR SMALL (MISCELLANEOUS) ×4 IMPLANT
SUT MON AB 4-0 PC3 18 (SUTURE) ×8 IMPLANT
SUT VICRYL 0 UR6 27IN ABS (SUTURE) IMPLANT
TOWEL OR 17X24 6PK STRL BLUE (TOWEL DISPOSABLE) ×4 IMPLANT
TRAY LAPAROSCOPIC (CUSTOM PROCEDURE TRAY) ×4 IMPLANT
TROCAR XCEL BLUNT TIP 100MML (ENDOMECHANICALS) ×4 IMPLANT
TROCAR XCEL NON-BLD 5MMX100MML (ENDOMECHANICALS) ×4 IMPLANT
TUBE CONNECTING 20'X1/4 (TUBING) ×1
TUBE CONNECTING 20X1/4 (TUBING) ×3 IMPLANT
TUBING INSUFFLATION (TUBING) ×4 IMPLANT

## 2016-02-17 NOTE — Op Note (Signed)

## 2016-02-17 NOTE — Anesthesia Procedure Notes (Signed)
Procedure Name: Intubation Date/Time: 02/17/2016 1:37 PM Performed by: Baxter Flattery Pre-anesthesia Checklist: Patient identified, Emergency Drugs available, Suction available and Patient being monitored Patient Re-evaluated:Patient Re-evaluated prior to inductionOxygen Delivery Method: Circle system utilized Preoxygenation: Pre-oxygenation with 100% oxygen Intubation Type: IV induction Ventilation: Mask ventilation without difficulty Laryngoscope Size: Miller and 2 Grade View: Grade II Tube type: Oral Tube size: 7.0 mm Number of attempts: 1 Airway Equipment and Method: Stylet Placement Confirmation: ETT inserted through vocal cords under direct vision,  positive ETCO2 and breath sounds checked- equal and bilateral Secured at: 21 cm Tube secured with: Tape Dental Injury: Teeth and Oropharynx as per pre-operative assessment

## 2016-02-17 NOTE — Interval H&P Note (Signed)
History and Physical Interval Note: no change in H and P  02/17/2016 1:04 PM  Kristine Herman  has presented today for surgery, with the diagnosis of symptomatic gallbladder sludge  The various methods of treatment have been discussed with the patient and family. After consideration of risks, benefits and other options for treatment, the patient has consented to  Procedure(s): LAPAROSCOPIC CHOLECYSTECTOMY WITH INTRAOPERATIVE CHOLANGIOGRAM (N/A) as a surgical intervention .  The patient's history has been reviewed, patient examined, no change in status, stable for surgery.  I have reviewed the patient's chart and labs.  Questions were answered to the patient's satisfaction.     [name]

## 2016-02-17 NOTE — Anesthesia Preprocedure Evaluation (Addendum)
Anesthesia Evaluation  Patient identified by MRN, date of birth, ID band Patient awake    Reviewed: Allergy & Precautions, H&P , NPO status , Patient's Chart, lab work & pertinent test results  Airway Mallampati: III  TM Distance: >3 FB Neck ROM: full    Dental no notable dental hx. (+) Teeth Intact, Dental Advisory Given   Pulmonary neg pulmonary ROS,    Pulmonary exam normal breath sounds clear to auscultation       Cardiovascular Exercise Tolerance: Good negative cardio ROS Normal cardiovascular exam Rhythm:regular Rate:Normal     Neuro/Psych negative neurological ROS  negative psych ROS   GI/Hepatic negative GI ROS, Neg liver ROS,   Endo/Other  negative endocrine ROS  Renal/GU negative Renal ROS     Musculoskeletal negative musculoskeletal ROS (+)   Abdominal   Peds  Hematology negative hematology ROS (+)   Anesthesia Other Findings       Reproductive/Obstetrics negative OB ROS                            Anesthesia Physical  Anesthesia Plan  ASA: I  Anesthesia Plan: General   Post-op Pain Management:    Induction: Intravenous and Inhalational  Airway Management Planned: Oral ETT  Additional Equipment:   Intra-op Plan:   Post-operative Plan: Extubation in OR  Informed Consent: I have reviewed the patients History and Physical, chart, labs and discussed the procedure including the risks, benefits and alternatives for the proposed anesthesia with the patient or authorized representative who has indicated his/her understanding and acceptance.   Dental advisory given  Plan Discussed with: CRNA and Anesthesiologist  Anesthesia Plan Comments:        Anesthesia Quick Evaluation

## 2016-02-17 NOTE — Discharge Instructions (Signed)
[facility] ______[facility] LAPAROSCOPIC SURGERY: POST OP INSTRUCTIONS Always review your discharge instruction sheet given to you by the facility where your surgery was performed. IF YOU HAVE DISABILITY OR FAMILY LEAVE FORMS, YOU MUST BRING THEM TO THE OFFICE FOR PROCESSING.   DO NOT GIVE THEM TO YOUR DOCTOR.  1. A prescription for pain medication may be given to you upon discharge.  Take your pain medication as prescribed, if needed.  If narcotic pain medicine is not needed, then you may take acetaminophen (Tylenol) or ibuprofen (Advil) as needed. 2. Take your usually prescribed medications unless otherwise directed. 3. If you need a refill on your pain medication, please contact your pharmacy.  They will contact our office to request authorization. Prescriptions will not be filled after 5pm or on week-ends. 4. You should follow a light diet the first few days after arrival home, such as soup and crackers, etc.  Be sure to include lots of fluids daily. 5. Most patients will experience some swelling and bruising in the area of the incisions.  Ice packs will help.  Swelling and bruising can take several days to resolve.  6. It is common to experience some constipation if taking pain medication after surgery.  Increasing fluid intake and taking a stool softener (such as Colace) will usually help or prevent this problem from occurring.  A mild laxative (Milk of Magnesia or Miralax) should be taken according to package instructions if there are no bowel movements after 48 hours. 7. Unless discharge instructions indicate otherwise, you may remove your bandages 24-48 hours after surgery, and you may shower at that time.  You may have steri-strips (small skin tapes) in place directly over the incision.  These strips should be left on the skin for 7-10 days.  If your surgeon used skin glue on the incision, you may shower in 24 hours.  The glue will flake off over the next 2-3 weeks.  Any sutures or  staples will be removed at the office during your follow-up visit. 8. ACTIVITIES:  You may resume regular (light) daily activities beginning the next day--such as daily self-care, walking, climbing stairs--gradually increasing activities as tolerated.  You may have sexual intercourse when it is comfortable.  Refrain from any heavy lifting or straining until approved by your doctor. a. You may drive when you are no longer taking prescription pain medication, you can comfortably wear a seatbelt, and you can safely maneuver your car and apply brakes. b. RETURN TO WORK:  __________________________________________________________ 9. You should see your doctor in the office for a follow-up appointment approximately 2-3 weeks after your surgery.  Make sure that you call for this appointment within a day or two after you arrive home to insure a convenient appointment time. 10. OTHER INSTRUCTIONS:OK TO SHOWER TOMORROW 11. ICE PACK AND IBUPROFEN ALSO FOR PAIN 12. NO LIFTING MORE THAN 15 POUNDS FOR 2 WEEKS __________________________________________________________________________________________________________________________ __________________________________________________________________________________________________________________________ WHEN TO CALL YOUR DOCTOR: 1. Fever over 101.0 2. Inability to urinate 3. Continued bleeding from incision. 4. Increased pain, redness, or drainage from the incision. 5. Increasing abdominal pain  The clinic staff is available to answer your questions during regular business hours.  Please dont hesitate to call and ask to speak to one of the nurses for clinical concerns.  If you have a medical emergency, go to the nearest emergency room or call 911.  A surgeon from [facility] is always on call at the hospital. [address] [place], [place], [place]  EO:[id number] ? [address], [place], [place]   [address] [phone] ? [phone] ? FAX [phone]  [phone] Web site: [email or web address]      Post Anesthesia Home Care Instructions  Activity: Get plenty of rest for the remainder of the day. A responsible adult should stay with you for 24 hours following the procedure.  For the next 24 hours, DO NOT: -Drive a car -Paediatric nurse -Drink alcoholic beverages -Take any medication unless instructed by your physician -Make any legal decisions or sign important papers.  Meals: Start with liquid foods such as gelatin or soup. Progress to regular foods as tolerated. Avoid greasy, spicy, heavy foods. If nausea and/or vomiting occur, drink only clear liquids until the nausea and/or vomiting subsides. Call your physician if vomiting continues.  Special Instructions/Symptoms: Your throat may feel dry or sore from the anesthesia or the breathing tube placed in your throat during surgery. If this causes discomfort, gargle with warm salt water. The discomfort should disappear within 24 hours.  If you had a scopolamine patch placed behind your ear for the management of post- operative nausea and/or vomiting:  1. The medication in the patch is effective for 72 hours, after which it should be removed.  Wrap patch in a tissue and discard in the trash. Wash hands thoroughly with soap and water. 2. You may remove the patch earlier than 72 hours if you experience unpleasant side effects which may include dry mouth, dizziness or visual disturbances. 3. Avoid touching the patch. Wash your hands with soap and water after contact with the patch.

## 2016-02-17 NOTE — Transfer of Care (Signed)
Immediate Anesthesia Transfer of Care Note  Patient: Kristine Herman  Procedure(s) Performed: Procedure(s): LAPAROSCOPIC CHOLECYSTECTOMY (N/A)  Patient Location: PACU  Anesthesia Type:General  Level of Consciousness: awake, alert  and patient cooperative  Airway & Oxygen Therapy: Patient Spontanous Breathing and Patient connected to face mask oxygen  Post-op Assessment: Report given to RN, Post -op Vital signs reviewed and stable and Patient moving all extremities  Post vital signs: Reviewed and stable  Last Vitals:  Vitals:   02/17/16 1220  BP: 122/76  Pulse: 81  Resp: 18  Temp: 36.7 C    Last Pain:  Vitals:   02/17/16 1220  TempSrc: Oral  PainSc: 5          Complications: No apparent anesthesia complications

## 2016-02-17 NOTE — Anesthesia Postprocedure Evaluation (Signed)
Anesthesia Post Note  Patient: Kristine Herman  Procedure(s) Performed: Procedure(s) (LRB): LAPAROSCOPIC CHOLECYSTECTOMY (N/A)  Patient location during evaluation: PACU Anesthesia Type: General Level of consciousness: sedated and patient cooperative Pain management: pain level controlled Vital Signs Assessment: post-procedure vital signs reviewed and stable Respiratory status: spontaneous breathing Cardiovascular status: stable Anesthetic complications: no    Last Vitals:  Vitals:   02/17/16 1630 02/17/16 1645  BP: (!) 113/59 110/64  Pulse: 86 77  Resp: (!) 21 19  Temp:      Last Pain:  Vitals:   02/17/16 1645  TempSrc:   PainSc: [place]

## 2016-02-18 ENCOUNTER — Encounter (HOSPITAL_BASED_OUTPATIENT_CLINIC_OR_DEPARTMENT_OTHER): Payer: Self-pay | Admitting: Surgery

## 2016-11-25 ENCOUNTER — Ambulatory Visit: Payer: BLUE CROSS/BLUE SHIELD | Admitting: Family Medicine

## 2017-08-31 ENCOUNTER — Other Ambulatory Visit: Payer: Self-pay | Admitting: Internal Medicine

## 2017-08-31 DIAGNOSIS — R202 Paresthesia of skin: Secondary | ICD-10-CM

## 2017-08-31 DIAGNOSIS — H538 Other visual disturbances: Secondary | ICD-10-CM

## 2017-08-31 DIAGNOSIS — G4489 Other headache syndrome: Secondary | ICD-10-CM

## 2017-09-09 ENCOUNTER — Ambulatory Visit
Admission: RE | Admit: 2017-09-09 | Discharge: 2017-09-09 | Disposition: A | Discharge status: Home / Self Care | Payer: BLUE CROSS/BLUE SHIELD | Source: Ambulatory Visit | Attending: Internal Medicine | Admitting: Internal Medicine

## 2017-09-09 DIAGNOSIS — H538 Other visual disturbances: Secondary | ICD-10-CM

## 2017-09-09 DIAGNOSIS — G4489 Other headache syndrome: Secondary | ICD-10-CM

## 2017-09-09 DIAGNOSIS — R202 Paresthesia of skin: Secondary | ICD-10-CM

## 2017-09-09 MED ORDER — IOPAMIDOL (ISOVUE-300) INJECTION 61%
75.0000 mL | Freq: Once | INTRAVENOUS | Status: AC | PRN
  Administered 2017-09-09: 75 mL via INTRAVENOUS

## 2020-02-07 IMAGING — CT CT HEAD WO/W CM
1 of 2 series · 13 of 30 positions shown, 17 images · IV contrast (iopamidol)
Comparison: None.

CLINICAL DATA: Headache blurred vision

EXAM:
CT HEAD WITHOUT AND WITH CONTRAST
TECHNIQUE: Contiguous axial images were obtained from the base of the skull
through the vertex without and with intravenous contrast
CONTRAST:  75mL [id number] IOPAMIDOL ([id number]) INJECTION 61%

[Series 2: head w/(date) · axial · 0.47mm/px · z∈[-11,+109]mm · 13 of 30 slices shown, 17 images]
[im 3/30  brain]
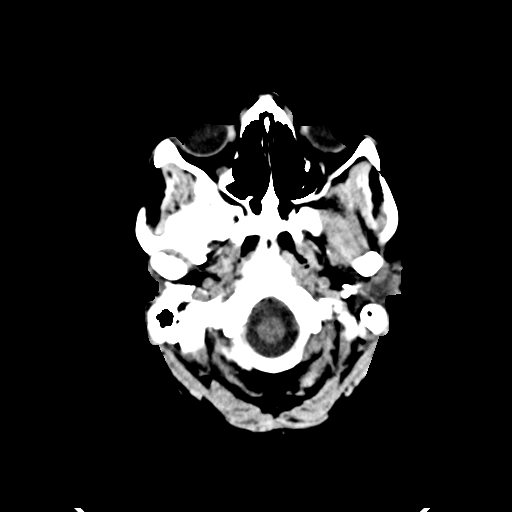
[im 3/30  bone]
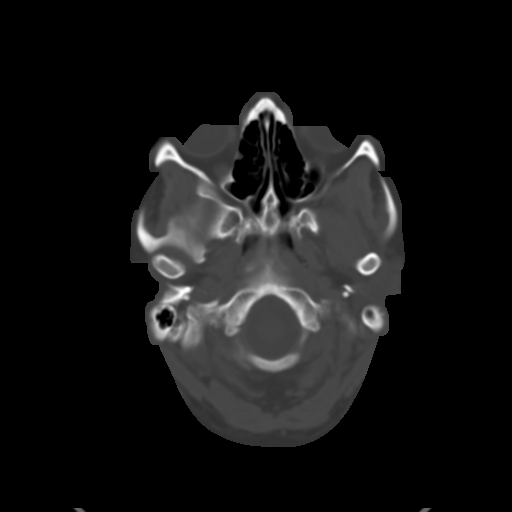
[im 5/30  brain]
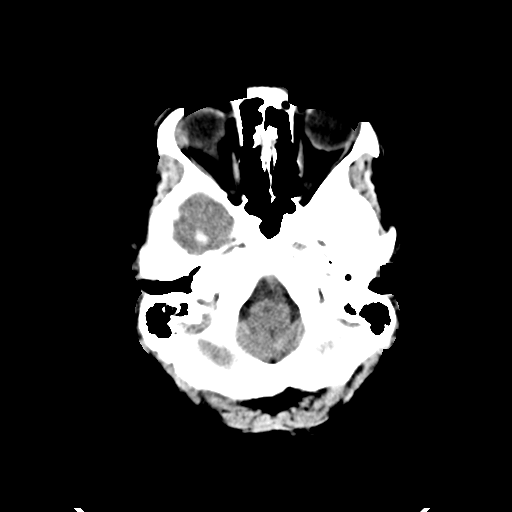
[im 7/30  brain]
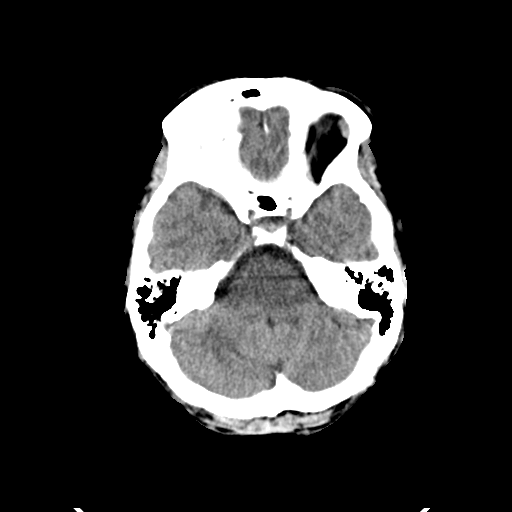
[im 9/30  brain]
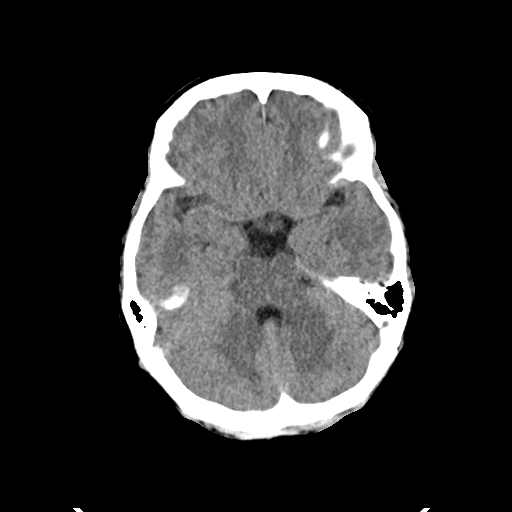
[im 11/30  brain]
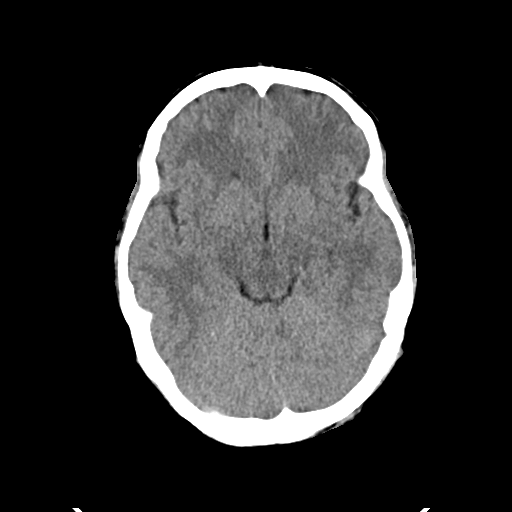
[im 11/30  bone]
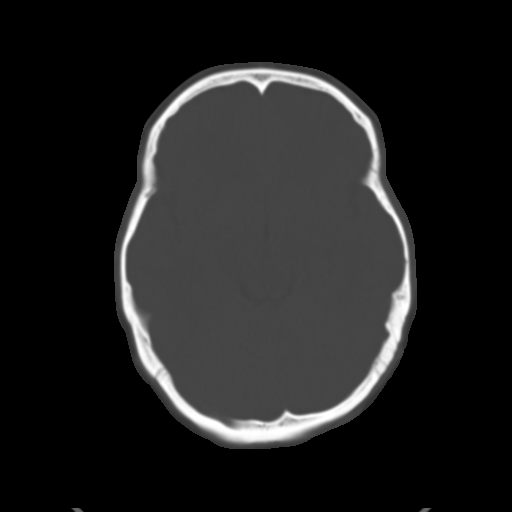
[im 13/30  brain]
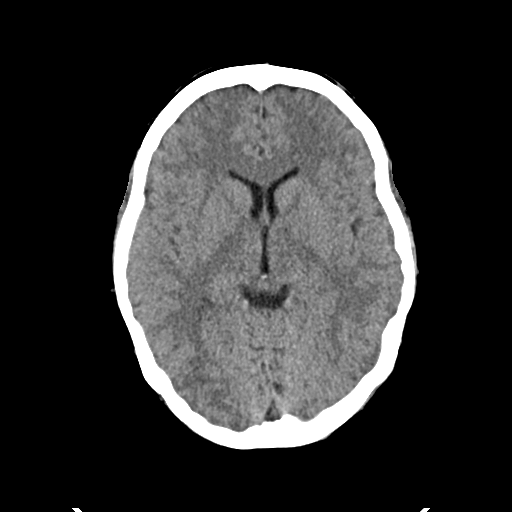
[im 15/30  brain]
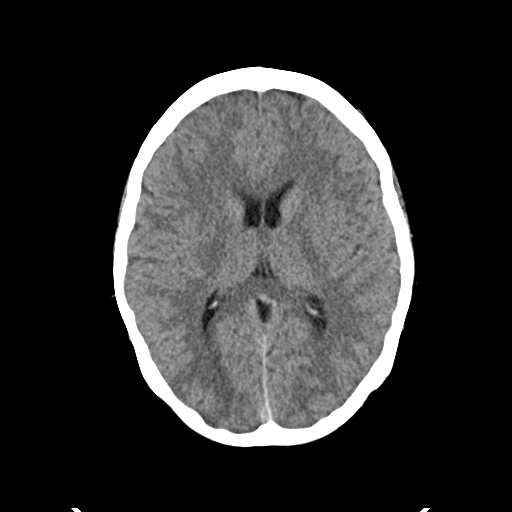
[im 17/30  brain]
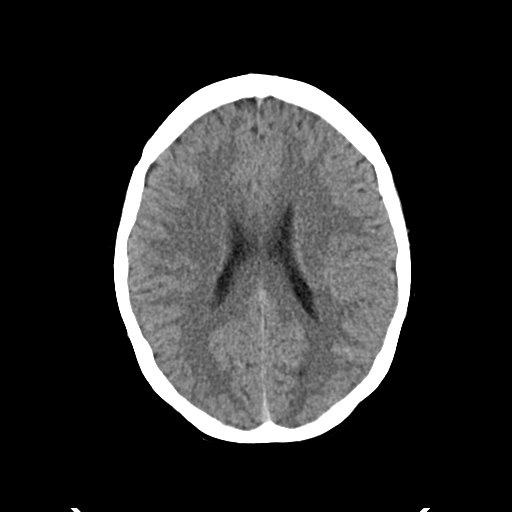
[im 19/30  brain]
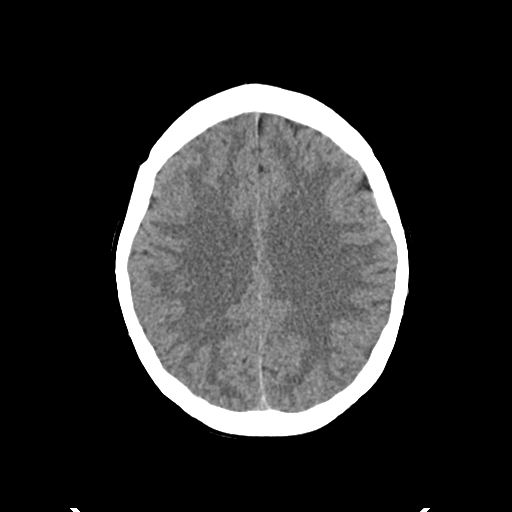
[im 19/30  bone]
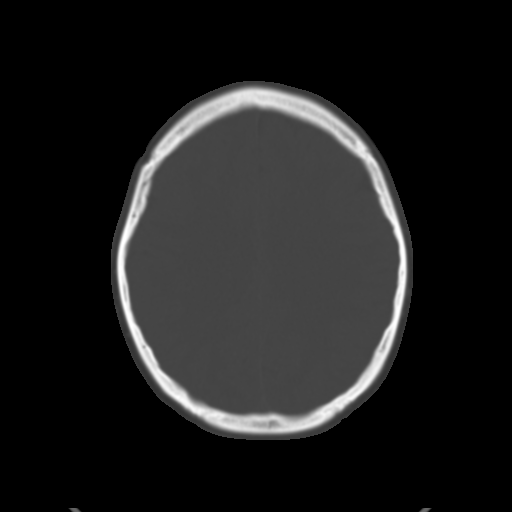
[im 21/30  brain]
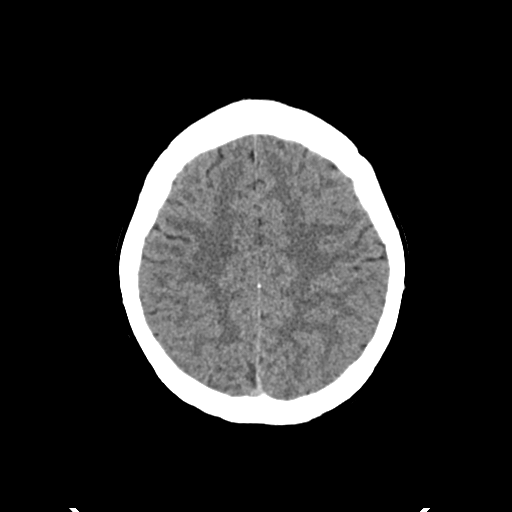
[im 23/30  brain]
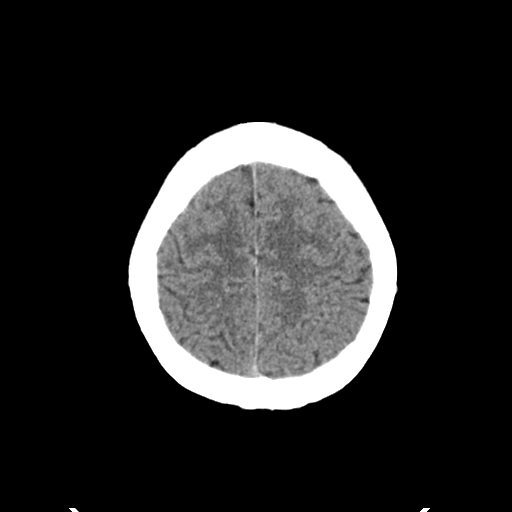
[im 25/30  brain]
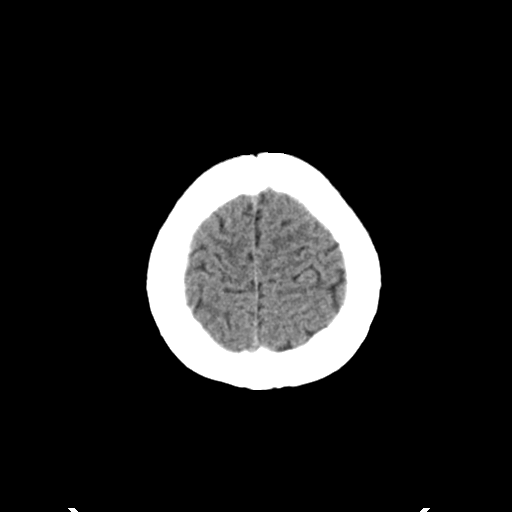
[im 27/30  brain]
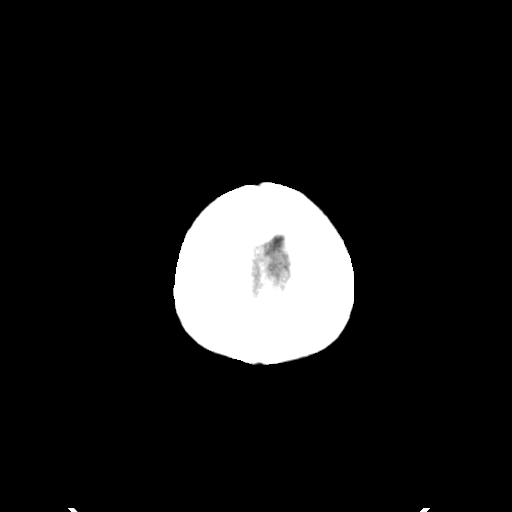
[im 27/30  bone]
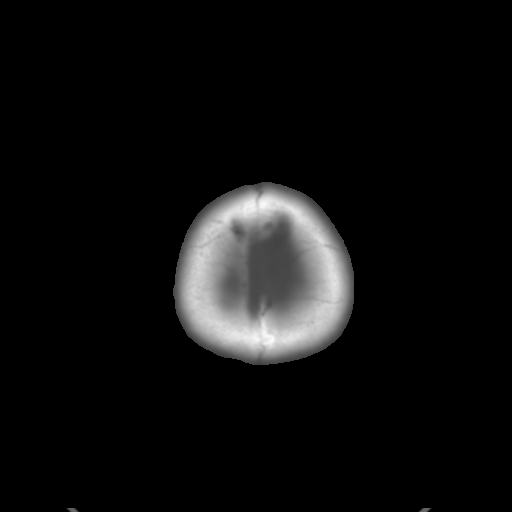

[13 of 30 positions shown; findings below may reference images not displayed]

FINDINGS: Brain: No evidence of acute infarction, hemorrhage, hydrocephalus,
extra-axial collection or mass lesion/mass effect. Normal
enhancement postcontrast administration.

Vascular: Negative for hyperdense vessel. Normal vascular
enhancement.

Skull: Negative

Sinuses/Orbits: Negative

Other: None
IMPRESSION: Normal CT head with contrast.
